# Patient Record
Sex: Female | Born: 1970 | Hispanic: No | Marital: Married | Smoking: Never smoker
Health system: Southern US, Community
[De-identification: ages and names within clinical notes are randomized; demographics above are authoritative.]

## PROBLEM LIST (undated history)

## (undated) DIAGNOSIS — S069XAA Unspecified intracranial injury with loss of consciousness status unknown, initial encounter: Secondary | ICD-10-CM

## (undated) DIAGNOSIS — J45909 Unspecified asthma, uncomplicated: Secondary | ICD-10-CM

## (undated) DIAGNOSIS — G4700 Insomnia, unspecified: Secondary | ICD-10-CM

## (undated) DIAGNOSIS — S069X9A Unspecified intracranial injury with loss of consciousness of unspecified duration, initial encounter: Secondary | ICD-10-CM

---

## 2017-03-09 ENCOUNTER — Emergency Department
Admission: EM | Admit: 2017-03-09 | Discharge: 2017-03-09 | Disposition: A | Discharge status: Home / Self Care | Payer: 59 | Source: Home / Self Care | Attending: Family Medicine | Admitting: Family Medicine

## 2017-03-09 ENCOUNTER — Encounter (HOSPITAL_BASED_OUTPATIENT_CLINIC_OR_DEPARTMENT_OTHER): Payer: Self-pay | Admitting: Emergency Medicine

## 2017-03-09 ENCOUNTER — Emergency Department (HOSPITAL_BASED_OUTPATIENT_CLINIC_OR_DEPARTMENT_OTHER): Payer: 59

## 2017-03-09 ENCOUNTER — Encounter: Payer: Self-pay | Admitting: Emergency Medicine

## 2017-03-09 ENCOUNTER — Emergency Department (HOSPITAL_BASED_OUTPATIENT_CLINIC_OR_DEPARTMENT_OTHER)
Admission: EM | Admit: 2017-03-09 | Discharge: 2017-03-09 | Disposition: A | Discharge status: Home / Self Care | Payer: 59 | Attending: Emergency Medicine | Admitting: Emergency Medicine

## 2017-03-09 DIAGNOSIS — R109 Unspecified abdominal pain: Secondary | ICD-10-CM | POA: Diagnosis not present

## 2017-03-09 DIAGNOSIS — J45909 Unspecified asthma, uncomplicated: Secondary | ICD-10-CM | POA: Diagnosis not present

## 2017-03-09 DIAGNOSIS — Z79899 Other long term (current) drug therapy: Secondary | ICD-10-CM | POA: Insufficient documentation

## 2017-03-09 DIAGNOSIS — N12 Tubulo-interstitial nephritis, not specified as acute or chronic: Secondary | ICD-10-CM

## 2017-03-09 DIAGNOSIS — N200 Calculus of kidney: Secondary | ICD-10-CM | POA: Diagnosis not present

## 2017-03-09 DIAGNOSIS — R1031 Right lower quadrant pain: Secondary | ICD-10-CM | POA: Diagnosis present

## 2017-03-09 DIAGNOSIS — R310 Gross hematuria: Secondary | ICD-10-CM

## 2017-03-09 HISTORY — DX: Insomnia, unspecified: G47.00

## 2017-03-09 HISTORY — DX: Unspecified asthma, uncomplicated: J45.909

## 2017-03-09 LAB — CBC
HEMATOCRIT: 43.7 % (ref 36.0–46.0)
HEMOGLOBIN: 14.8 g/dL (ref 12.0–15.0)
MCH: 30.1 pg (ref 26.0–34.0)
MCHC: 33.9 g/dL (ref 30.0–36.0)
MCV: 88.8 fL (ref 78.0–100.0)
Platelets: 274 10*3/uL (ref 150–400)
RBC: 4.92 MIL/uL (ref 3.87–5.11)
RDW: 13.7 % (ref 11.5–15.5)
WBC: 12.4 10*3/uL — ABNORMAL HIGH (ref 4.0–10.5)

## 2017-03-09 LAB — BASIC METABOLIC PANEL
Anion gap: 7 (ref 5–15)
BUN: 8 mg/dL (ref 6–20)
CO2: 27 mmol/L (ref 22–32)
Calcium: 9.1 mg/dL (ref 8.9–10.3)
Chloride: 104 mmol/L (ref 101–111)
Creatinine, Ser: 0.78 mg/dL (ref 0.44–1.00)
GFR calc Af Amer: >60 mL/min (ref >60)
GLUCOSE: 125 mg/dL — AB (ref 65–99)
POTASSIUM: 4.5 mmol/L (ref 3.5–5.1)
Sodium: 138 mmol/L (ref 135–145)

## 2017-03-09 LAB — POCT URINALYSIS DIP (MANUAL ENTRY)
BILIRUBIN UA: NEGATIVE
BILIRUBIN UA: NEGATIVE mg/dL
GLUCOSE UA: NEGATIVE mg/dL
Nitrite, UA: NEGATIVE
Protein Ur, POC: 100 mg/dL — AB
SPEC GRAV UA: 1.015 (ref 1.010–1.025)
UROBILINOGEN UA: 0.2 U/dL
pH, UA: 6.5 (ref 5.0–8.0)

## 2017-03-09 LAB — PREGNANCY, URINE: Preg Test, Ur: NEGATIVE

## 2017-03-09 MED ORDER — OXYCODONE-ACETAMINOPHEN 5-325 MG PO TABS
1.0000 | ORAL_TABLET | Freq: Once | ORAL | Status: AC
  Administered 2017-03-09: 1 via ORAL
  Filled 2017-03-09: qty 1

## 2017-03-09 MED ORDER — HYDROCODONE-ACETAMINOPHEN 5-325 MG PO TABS: 1.0000 | ORAL_TABLET | Freq: Four times a day (QID) | ORAL | 0 refills | Status: AC | PRN

## 2017-03-09 MED ORDER — CEPHALEXIN 500 MG PO CAPS: 500.0000 mg | ORAL_CAPSULE | Freq: Two times a day (BID) | ORAL | 0 refills | Status: AC

## 2017-03-09 MED ORDER — CEPHALEXIN 250 MG PO CAPS
500.0000 mg | ORAL_CAPSULE | Freq: Once | ORAL | Status: AC
  Administered 2017-03-09: 500 mg via ORAL
  Filled 2017-03-09: qty 2

## 2017-03-09 MED ORDER — ONDANSETRON 4 MG PO TBDP
4.0000 mg | ORAL_TABLET | Freq: Once | ORAL | Status: AC | PRN
  Administered 2017-03-09: 4 mg via ORAL
  Filled 2017-03-09: qty 1

## 2017-03-09 MED ORDER — OXYCODONE-ACETAMINOPHEN 5-325 MG PO TABS
1.0000 | ORAL_TABLET | ORAL | Status: DC | PRN
  Administered 2017-03-09: 1 via ORAL
  Filled 2017-03-09: qty 1

## 2017-03-09 NOTE — ED Triage Notes (Addendum)
R flank pain, nausea,  dysuria x 2 days, sent from UC.

## 2017-03-09 NOTE — ED Provider Notes (Signed)
[facility]    CSN: [id number] Arrival date & time: 03/09/17  1641     History   Chief Complaint Chief Complaint  Patient presents with  . Flank Pain    HPI Linda Mcneil is a 46 y.o. female.   Patient developed vague dysuria, urgency and frequency two days ago with lower abdominal pressure.  This morning she developed right flank pain and gross hematuria.  No fevers, chills, and sweats.  No past history of kidney stones.   The history is provided by the patient.  Flank Pain  This is a new problem. The current episode started 6 to 12 hours ago. The problem occurs constantly. The problem has been gradually worsening. Pertinent negatives include no abdominal pain and no shortness of breath. The symptoms are aggravated by walking, bending and twisting. Nothing relieves the symptoms. She has tried nothing for the symptoms.    Past Medical History:  Diagnosis Date  . Asthma   . Insomnia     There are no active problems to display for this patient.   History reviewed. No pertinent surgical history.  OB History    No data available       Home Medications    Prior to Admission medications   Medication Sig Start Date End Date Taking? Authorizing Provider  BuPROPion HCl (WELLBUTRIN PO) Take by mouth.   Yes [provider]  escitalopram (LEXAPRO) 20 MG tablet Take 20 mg by mouth daily.   Yes [provider]  lansoprazole (PREVACID) 30 MG capsule Take 30 mg by mouth daily at 12 noon.   Yes [provider]  montelukast (SINGULAIR) 10 MG tablet Take 10 mg by mouth at bedtime.   Yes [provider]    Family History History reviewed. No pertinent family history.  Social History Social History  Substance Use Topics  . Smoking status: Never Smoker  . Smokeless tobacco: Never Used  . Alcohol use Yes     Comment: 2-3 week      Allergies   Allegra [fexofenadine]; Azithromycin; Claritin [loratadine]; and Zyrtec  [cetirizine]   Review of Systems Review of Systems  Constitutional: Positive for fatigue and fever. Negative for appetite change, chills and diaphoresis.  Respiratory: Negative for shortness of breath.   Gastrointestinal: Negative for abdominal pain.  Genitourinary: Positive for dysuria, flank pain, frequency, hematuria and urgency. Negative for decreased urine volume and pelvic pain.  All other systems reviewed and are negative.    Physical Exam Triage Vital Signs ED Triage Vitals  Enc Vitals Group     BP 03/09/17 1701 125/87     Pulse --      Resp 03/09/17 1701 16     Temp 03/09/17 1701 97.9 F (36.6 C)     Temp Source 03/09/17 1701 Oral     SpO2 03/09/17 1701 100 %     Weight 03/09/17 1702 159 lb (72.1 kg)     Height 03/09/17 1702 5' 5.5" (1.664 m)     Head Circumference --      Peak Flow --      Pain Score --      Pain Loc --      Pain Edu? --      Excl. in [place]? --    No data found.   Updated Vital Signs BP 125/87 (BP Location: Left Arm)   Temp 97.9 F (36.6 C) (Oral)   Resp 16   Ht 5' 5.5" (1.664 m)   Wt 159 lb (  72.1 kg)   LMP 03/03/2017 (Exact Date)   SpO2 100%   BMI 26.06 kg/m   Visual Acuity Right Eye Distance:   Left Eye Distance:   Bilateral Distance:    Right Eye Near:   Left Eye Near:    Bilateral Near:     Physical Exam Nursing notes and Vital Signs reviewed. Appearance:  Patient appears stated age, and in no acute distress.    Eyes:  Pupils are equal, round, and reactive to light and accomodation.  Extraocular movement is intact.  Conjunctivae are not inflamed   Pharynx:  Normal; moist mucous membranes  Neck:  Supple.  No adenopathy Lungs:  Clear to auscultation.  Breath sounds are equal.  Moving air well. Heart:  Regular rate and rhythm without murmurs, rubs, or gallops.  Abdomen:  Nontender without masses or hepatosplenomegaly.  Bowel sounds are present.  Right flank tenderness is present.  Extremities:  No edema.  Skin:  No rash  present.     UC Treatments / Results  Labs (all labs ordered are listed, but only abnormal results are displayed) Labs Reviewed  POCT URINALYSIS DIP (MANUAL ENTRY) - Abnormal; Notable for the following:       Result Value   Clarity, UA cloudy (*)    Blood, UA large (*)    Protein Ur, POC =100 (*)    Leukocytes, UA Moderate (2+) (*)    All other components within normal limits  URINE CULTURE    EKG  EKG Interpretation None       Radiology No results found.  Procedures Procedures (including critical care time)  Medications Ordered in UC Medications - No data to display   Initial Impression / Assessment and Plan / UC Course  I have reviewed the triage vital signs and the nursing notes.  Pertinent labs & imaging results that were available during my care of the patient were reviewed by me and considered in my medical decision making (see chart for details).    Note large blood and moderate leuks on urinalysis. Suspect urolithiasis. Advised to proceed immediately to [facility] ED for CT renal study and further evaluation.    Final Clinical Impressions(s) / UC Diagnoses   Final diagnoses:  Right flank pain  Gross hematuria    New Prescriptions New Prescriptions   No medications on file        Kandra Nicolas, MD 03/09/17 [phone]

## 2017-03-09 NOTE — ED Notes (Signed)
Pt UA done at [facility] and was told she had a lot of blood in her urine.

## 2017-03-09 NOTE — ED Notes (Signed)
Patient c/o 2 days pain with urination, without burning, and increased frequency. Patient states pain is right flank.

## 2017-03-09 NOTE — ED Triage Notes (Signed)
Patient c/o flank pain, burning with urination

## 2017-03-10 NOTE — ED Provider Notes (Signed)
[place] [facility] Provider Note   CSN: [id number] Arrival date & time: 03/09/17  1826     History   Chief Complaint Chief Complaint  Patient presents with  . Flank Pain    HPI Linda Mcneil is a 46 y.o. female.  Patient is a 46 year old female with a prior medical history of asthma presenting today from urgent care for right-sided flank pain. Patient states yesterday she had some lower abdominal discomfort, dysuria, frequency and hematuria. She started drinking cranberry juice and water and symptoms seem to improve till later today when she started to develop severe right-sided flank pain. She describes the pain as sharp and crampy and worse with certain positions. She is having some pressure over her bladder but states that has significantly improved. She has had some nausea and a temperature of 99 but no fever over 100. She denies vomiting. No prior history of pyelonephritis, kidney stones or recurrent urinary tract infections. Last menstrual period was last week. Patient had a UA done at urgent care which showed leukocytes and hemoglobin on dip.   The history is provided by the patient.    Past Medical History:  Diagnosis Date  . Asthma   . Insomnia     There are no active problems to display for this patient.   History reviewed. No pertinent surgical history.  OB History    No data available       Home Medications    Prior to Admission medications   Medication Sig Start Date End Date Taking? Authorizing Provider  BuPROPion HCl (WELLBUTRIN PO) Take by mouth.    [provider]  cephALEXin (KEFLEX) 500 MG capsule Take 1 capsule (500 mg total) by mouth 2 (two) times daily. 03/09/17   Blanchie Dessert, MD  escitalopram (LEXAPRO) 20 MG tablet Take 20 mg by mouth daily.    [provider]  HYDROcodone-acetaminophen (NORCO/VICODIN) 5-325 MG tablet Take 1 tablet by mouth every 6 (six) hours as needed. 03/09/17   Blanchie Dessert, MD  lansoprazole  (PREVACID) 30 MG capsule Take 30 mg by mouth daily at 12 noon.    [provider]  montelukast (SINGULAIR) 10 MG tablet Take 10 mg by mouth at bedtime.    [provider]    Family History No family history on file.  Social History Social History  Substance Use Topics  . Smoking status: Never Smoker  . Smokeless tobacco: Never Used  . Alcohol use Yes     Comment: 2-3 week      Allergies   Allegra [fexofenadine]; Azithromycin; Claritin [loratadine]; and Zyrtec [cetirizine]   Review of Systems Review of Systems  All other systems reviewed and are negative.    Physical Exam Updated Vital Signs BP (!) 121/94 (BP Location: Right Arm)   Pulse 75   Temp 99.5 F (37.5 C) (Oral)   Resp 18   LMP 03/03/2017 (Exact Date)   SpO2 99%   Physical Exam  Constitutional: She is oriented to person, place, and time. She appears well-developed and well-nourished. No distress.  HENT:  Head: Normocephalic and atraumatic.  Mouth/Throat: Oropharynx is clear and moist.  Eyes: Pupils are equal, round, and reactive to light. Conjunctivae and EOM are normal.  Neck: Normal range of motion. Neck supple.  Cardiovascular: Normal rate, regular rhythm and intact distal pulses.   No murmur heard. Pulmonary/Chest: Effort normal and breath sounds normal. No respiratory distress. She has no wheezes. She has no rales.  Abdominal: Soft. She exhibits no distension. There  is no tenderness. There is CVA tenderness. There is no rebound and no guarding.  Right-sided flank tenderness  Musculoskeletal: Normal range of motion. She exhibits no edema or tenderness.  Neurological: She is alert and oriented to person, place, and time.  Skin: Skin is warm and dry. No rash noted. No erythema.  Psychiatric: She has a normal mood and affect. Her behavior is normal.  Nursing note and vitals reviewed.    ED Treatments / Results  Labs (all labs ordered are listed, but only abnormal results are  displayed) Labs Reviewed  BASIC METABOLIC PANEL - Abnormal; Notable for the following:       Result Value   Glucose, Bld 125 (*)    All other components within normal limits  CBC - Abnormal; Notable for the following:    WBC 12.4 (*)    All other components within normal limits  URINE CULTURE  PREGNANCY, URINE    EKG  EKG Interpretation None       Radiology Ct Renal Stone Study  Result Date: 03/09/2017 CLINICAL DATA:  46 year old female with flank pain and burning with urination for 2 days. EXAM: CT ABDOMEN AND PELVIS WITHOUT CONTRAST TECHNIQUE: Multidetector CT imaging of the abdomen and pelvis was performed following the standard protocol without IV contrast. COMPARISON:  None. FINDINGS: Lower chest: Normal.  No pleural or pericardial effusion. Hepatobiliary: Negative noncontrast liver and gallbladder. Pancreas: Negative. Spleen: Negative. Adrenals/Urinary Tract: Normal adrenal glands. Noncontrast left kidney and left ureter appear normal. The right renal collecting system and renal sinus fat are asymmetrically indistinct but there is no definite right hydronephrosis. There is associated right periureteral stranding, but no definite hydroureter or right ureteral calculus. No nephrolithiasis identified. Diminutive urinary bladder.  Mild perivesical stranding. Stomach/Bowel: Redundant sigmoid colon with retained stool throughout the rectum and sigmoid. Retained stool throughout the left colon. Less retained stool in the transverse and right colon. Normal retrocecal appendix. Negative terminal ileum. No dilated small bowel. Unremarkable stomach and duodenum. No abdominal free fluid. Vascular/Lymphatic: Vascular patency is not evaluated in the absence of IV contrast. No lymphadenopathy identified. Reproductive: Negative. Other: No pelvic free fluid. Musculoskeletal: No acute osseous abnormality identified. IMPRESSION: 1. Mild inflammatory stranding along the margins of the urinary bladder  compatible with UTI. Superimposed right periureteral and right renal sinus inflammation suggesting ascending infection on the right side. Note that pyelonephritis cannot be excluded in the absence of IV contrast. 2. No urologic calculus identified, and no obstructive uropathy suspected. 3. Normal appendix. Retained stool in the colon which is redundant distally. Electronically Signed   By: Genevie Ann M.D.   On: 03/09/2017 21:44    Procedures Procedures (including critical care time)  Medications Ordered in ED Medications  oxyCODONE-acetaminophen (PERCOCET/ROXICET) 5-325 MG per tablet 1 tablet (1 tablet Oral Given 03/09/17 1845)  ondansetron (ZOFRAN-ODT) disintegrating tablet 4 mg (4 mg Oral Given 03/09/17 1845)  cephALEXin (KEFLEX) capsule 500 mg (500 mg Oral Given 03/09/17 2243)  oxyCODONE-acetaminophen (PERCOCET/ROXICET) 5-325 MG per tablet 1 tablet (1 tablet Oral Given 03/09/17 2243)     Initial Impression / Assessment and Plan / ED Course  I have reviewed the triage vital signs and the nursing notes.  Pertinent labs & imaging results that were available during my care of the patient were reviewed by me and considered in my medical decision making (see chart for details).     Patient presenting with symptoms concerning for renal colic versus pyelonephritis. Patient is well-appearing on exam and pain improved after  receiving pain medication in the waiting room. CT of the abdomen and pelvis show that patient has evidence most suggestive of pyelonephritis. Patient has a mild leukocytosis of 12 but otherwise CBC and CMP are within normal limits. Urine culture was sent and patient was started on Keflex. She is also given pain control.  Final Clinical Impressions(s) / ED Diagnoses   Final diagnoses:  Pyelonephritis    New Prescriptions Discharge Medication List as of 03/09/2017 10:25 PM    START taking these medications   Details  cephALEXin (KEFLEX) 500 MG capsule Take 1 capsule (500 mg total) by  mouth 2 (two) times daily., Starting Sun 03/09/2017, Print    HYDROcodone-acetaminophen (NORCO/VICODIN) 5-325 MG tablet Take 1 tablet by mouth every 6 (six) hours as needed., Starting Sun 03/09/2017, Print         Blanchie Dessert, MD 03/10/17 [phone]

## 2017-03-12 LAB — URINE CULTURE: Culture: >=100000 — AB

## 2017-03-13 ENCOUNTER — Telehealth: Payer: Self-pay | Admitting: *Deleted

## 2017-03-13 NOTE — Progress Notes (Signed)
ED Antimicrobial Stewardship Positive Culture Follow Up  Linda Mcneil is an 46 y.o. female who presented to [facility] on 03/09/2017 with a chief complaint of  Chief Complaint  Patient presents with  . Flank Pain   ? Recent Results (from the past 720 hour(s))  Urine Culture     Status: Abnormal   Collection Time: 03/09/17  9:11 PM  Result Value Ref Range Status   Specimen Description URINE, CATHETERIZED  Final   Special Requests NONE  Final   Culture >=100,000 COLONIES/mL STAPHYLOCOCCUS SAPROPHYTICUS (A)  Final   Report Status 03/12/2017 FINAL  Final   Organism ID, Bacteria STAPHYLOCOCCUS SAPROPHYTICUS (A)  Final      Susceptibility   Staphylococcus saprophyticus - MIC*    CIPROFLOXACIN <=0.5 SENSITIVE Sensitive     GENTAMICIN <=0.5 SENSITIVE Sensitive     NITROFURANTOIN <=16 SENSITIVE Sensitive     OXACILLIN 2 RESISTANT Resistant     TETRACYCLINE <=1 SENSITIVE Sensitive     VANCOMYCIN <=0.5 SENSITIVE Sensitive     TRIMETH/SULFA <=10 SENSITIVE Sensitive     CLINDAMYCIN <=0.25 SENSITIVE Sensitive     RIFAMPIN <=0.5 SENSITIVE Sensitive     Inducible Clindamycin NEGATIVE Sensitive     * >=100,000 COLONIES/mL STAPHYLOCOCCUS SAPROPHYTICUS   ? Treated with cephalexin, organism resistant to prescribed antimicrobial ? New antibiotic prescription: Bactrim 1 DS tablet PO BID for 14 days  ? ED Provider: Margarita Mail PA-C ? Duayne Cal 03/13/2017, 11:38 AM Infectious Diseases Pharmacist Phone# [phone]

## 2017-03-13 NOTE — Telephone Encounter (Signed)
Post ED Visit - Positive Culture Follow-up: Unsuccessful Patient Follow-up  Culture assessed and recommendations reviewed by:  []  Elenor Quinones, Pharm.D. []  Heide Guile, Pharm.D., BCPS AQ-ID []  Parks Neptune, Pharm.D., BCPS []  Alycia Rossetti, Pharm.D., BCPS []  [place], Pharm.D., BCPS, AAHIVP []  Legrand Como, Pharm.D., BCPS, AAHIVP []  Salome Arnt, PharmD, BCPS []  Dimitri Ped, PharmD, BCPS [x]  Vincenza Hews, PharmD, BCPS  Positive urine culture, reviewed by Margarita Mail PA-C  []  Patient discharged without antimicrobial prescription and treatment is now indicated [x]  Organism is resistant to prescribed ED discharge antimicrobial []  Patient with positive blood cultures   Unable to contact patient after 3 attempts, letter will be sent to address on file  Ardeen Fillers 03/13/2017, 2:48 PM

## 2017-05-03 ENCOUNTER — Emergency Department
Admission: EM | Admit: 2017-05-03 | Discharge: 2017-05-03 | Disposition: A | Discharge status: Home / Self Care | Payer: 59 | Source: Home / Self Care | Attending: Family Medicine | Admitting: Family Medicine

## 2017-05-03 ENCOUNTER — Encounter: Payer: Self-pay | Admitting: Emergency Medicine

## 2017-05-03 DIAGNOSIS — B9789 Other viral agents as the cause of diseases classified elsewhere: Secondary | ICD-10-CM

## 2017-05-03 DIAGNOSIS — J4541 Moderate persistent asthma with (acute) exacerbation: Secondary | ICD-10-CM | POA: Diagnosis not present

## 2017-05-03 DIAGNOSIS — J069 Acute upper respiratory infection, unspecified: Secondary | ICD-10-CM

## 2017-05-03 HISTORY — DX: Unspecified intracranial injury with loss of consciousness of unspecified duration, initial encounter: S06.9X9A

## 2017-05-03 HISTORY — DX: Unspecified intracranial injury with loss of consciousness status unknown, initial encounter: S06.9XAA

## 2017-05-03 MED ORDER — DOXYCYCLINE HYCLATE 100 MG PO CAPS: 100.0000 mg | ORAL_CAPSULE | Freq: Two times a day (BID) | ORAL | 0 refills | Status: AC

## 2017-05-03 MED ORDER — METHYLPREDNISOLONE SODIUM SUCC 125 MG IJ SOLR
125.0000 mg | Freq: Once | INTRAMUSCULAR | Status: AC
  Administered 2017-05-03: 125 mg via INTRAMUSCULAR

## 2017-05-03 MED ORDER — PREDNISONE 20 MG PO TABS: ORAL_TABLET | ORAL | 0 refills | Status: AC

## 2017-05-03 NOTE — ED Triage Notes (Signed)
Patient has chronic asthma but has had more wheezing than usual over past 2 weeks and her inhalers are not providing lasting relief.

## 2017-05-03 NOTE — ED Provider Notes (Signed)
[facility]    CSN: [id number] Arrival date & time: 05/03/17  1053     History   Chief Complaint Chief Complaint  Patient presents with  . Wheezing    HPI Linda Mcneil is a 46 y.o. female.   Patient developed mild URI symptoms about two weeks ago that generally improved except for persistent cough, wheezing, and shortness of breath with activity.  She has a history of moderate persistent asthma that is generally controlled with Singulair, Breo Ellipta, and ProAir inhaler.  She now feels well but her wheezing is not improved with her inhalers.  No fevers, chills, and sweats.   The history is provided by the patient.    Past Medical History:  Diagnosis Date  . Asthma   . Insomnia   . TBI (traumatic brain injury) ([place])     There are no active problems to display for this patient.   History reviewed. No pertinent surgical history.  OB History    No data available       Home Medications    Prior to Admission medications   Medication Sig Start Date End Date Taking? Authorizing Provider  busPIRone (BUSPAR) 10 MG tablet Take 10 mg by mouth 3 (three) times daily.   Yes [provider]  fluticasone furoate-vilanterol (BREO ELLIPTA) 200-25 MCG/INH AEPB Inhale 1 puff into the lungs daily.   Yes [provider]  BuPROPion HCl (WELLBUTRIN PO) Take by mouth.    [provider]  cephALEXin (KEFLEX) 500 MG capsule Take 1 capsule (500 mg total) by mouth 2 (two) times daily. 03/09/17   Blanchie Dessert, MD  doxycycline (VIBRAMYCIN) 100 MG capsule Take 1 capsule (100 mg total) by mouth 2 (two) times daily. Take with food (Rx void after 05/11/17) 05/03/17   Kandra Nicolas, MD  escitalopram (LEXAPRO) 20 MG tablet Take 20 mg by mouth daily.    [provider]  HYDROcodone-acetaminophen (NORCO/VICODIN) 5-325 MG tablet Take 1 tablet by mouth every 6 (six) hours as needed. 03/09/17   Blanchie Dessert, MD  lansoprazole (PREVACID) 30 MG  capsule Take 30 mg by mouth daily at 12 noon.    [provider]  montelukast (SINGULAIR) 10 MG tablet Take 10 mg by mouth at bedtime.    [provider]  predniSONE (DELTASONE) 20 MG tablet Take one tab by mouth twice daily for 4 days, then one daily for 3 days. Take with food. 05/03/17   Kandra Nicolas, MD    Family History No family history on file.  Social History Social History  Substance Use Topics  . Smoking status: Never Smoker  . Smokeless tobacco: Never Used  . Alcohol use Yes     Comment: 2-3 week      Allergies   Allegra [fexofenadine]; Azithromycin; Claritin [loratadine]; and Zyrtec [cetirizine]   Review of Systems Review of Systems  + sore throat, resolved + cough No pleuritic pain + wheezing + nasal congestion, improved + post-nasal drainage No sinus pain/pressure No itchy/red eyes No earache No hemoptysis + SOB with activity No fever/chills No nausea No vomiting No abdominal pain No diarrhea No urinary symptoms No skin rash + fatigue No myalgias No headache Used OTC meds without relief    Physical Exam Triage Vital Signs ED Triage Vitals  Enc Vitals Group     BP 05/03/17 1113 119/84     Pulse Rate 05/03/17 1113 84     Resp 05/03/17 1113 18     Temp 05/03/17 1113  97.8 F (36.6 C)     Temp Source 05/03/17 1113 Oral     SpO2 05/03/17 1113 97 %     Weight 05/03/17 1119 164 lb (74.4 kg)     Height 05/03/17 1119 5' 6.5" (1.689 m)     Head Circumference --      Peak Flow --      Pain Score --      Pain Loc --      Pain Edu? --      Excl. in [place]? --    No data found.   Updated Vital Signs BP 119/84 (BP Location: Left Arm)   Pulse 84   Temp 97.8 F (36.6 C) (Oral)   Resp 18   Ht 5' 6.5" (1.689 m)   Wt 164 lb (74.4 kg)   LMP 05/03/2017 (Exact Date)   SpO2 97%   BMI 26.07 kg/m   Visual Acuity Right Eye Distance:   Left Eye Distance:   Bilateral Distance:    Right Eye Near:   Left Eye Near:    Bilateral  Near:     Physical Exam Nursing notes and Vital Signs reviewed. Appearance:  Patient appears stated age, and in no acute distress Eyes:  Pupils are equal, round, and reactive to light and accomodation.  Extraocular movement is intact.  Conjunctivae are not inflamed  Ears:  Canals normal.  Tympanic membranes normal.  Nose:  Mildly congested turbinates.  No sinus tenderness.   Pharynx:  Normal Neck:  Supple.  Enlarged posterior/lateral nodes are palpated bilaterally, tender to palpation on the left.   Lungs:   Faint bilateral expiratory wheezes.  Breath sounds are equal.  Moving air well. Heart:  Regular rate and rhythm without murmurs, rubs, or gallops.  Abdomen:  Nontender without masses or hepatosplenomegaly.  Bowel sounds are present.  No CVA or flank tenderness.  Extremities:  No edema.  Skin:  No rash present.    UC Treatments / Results  Labs (all labs ordered are listed, but only abnormal results are displayed) Labs Reviewed - No data to display  EKG  EKG Interpretation None       Radiology No results found.  Procedures Procedures (including critical care time)  Medications Ordered in UC Medications  methylPREDNISolone sodium succinate (SOLU-MEDROL) 125 mg/2 mL injection 125 mg (125 mg Intramuscular Given 05/03/17 1137)     Initial Impression / Assessment and Plan / UC Course  I have reviewed the triage vital signs and the nursing notes.  Pertinent labs & imaging results that were available during my care of the patient were reviewed by me and considered in my medical decision making (see chart for details).    There is no evidence of bacterial infection today.   Administered Solumedrol 133m IM  Begin prednisone burst/taper on Sunday 05/04/17. Take plain guaifenesin (12043mextended release tabs such as Mucinex) twice daily, with plenty of water, for cough and congestion.   May take Delsym Cough Suppressant at bedtime for nighttime cough.  Stop all  antihistamines for now, and other non-prescription cough/cold preparations. Continue all inhalers as prescribed.  Continue Singulair. Begin Doxycycline if not improving about five days or if persistent fever develops (Given a prescription to hold, with an expiration date)  Follow-up with family doctor if not improving about10 days.     Final Clinical Impressions(s) / UC Diagnoses   Final diagnoses:  Moderate persistent asthma with (acute) exacerbation  Viral URI with cough    New Prescriptions New Prescriptions  DOXYCYCLINE (VIBRAMYCIN) 100 MG CAPSULE    Take 1 capsule (100 mg total) by mouth 2 (two) times daily. Take with food (Rx void after 05/11/17)   PREDNISONE (DELTASONE) 20 MG TABLET    Take one tab by mouth twice daily for 4 days, then one daily for 3 days. Take with food.        Kandra Nicolas, MD 05/05/17 [phone]

## 2017-05-03 NOTE — Discharge Instructions (Signed)
Begin prednisone on Sunday 05/04/17. Take plain guaifenesin (1269m extended release tabs such as Mucinex) twice daily, with plenty of water, for cough and congestion.   May take Delsym Cough Suppressant at bedtime for nighttime cough.  Stop all antihistamines for now, and other non-prescription cough/cold preparations. Continue all inhalers as prescribed.  Continue Singulair. Begin Doxycycline if not improving about five days or if persistent fever develops   Follow-up with family doctor if not improving about10 days.

## 2017-06-02 ENCOUNTER — Telehealth: Payer: Self-pay | Admitting: Emergency Medicine

## 2017-06-02 NOTE — Telephone Encounter (Signed)
Lost to followup 

## 2019-08-26 IMAGING — CT CT RENAL STONE PROTOCOL
2 of 4 series · 16 of 46 positions shown, 18 images · non-contrast
Comparison: None.

CLINICAL DATA: 45-year-old female with flank pain and burning with
urination for 2 days.

EXAM:
CT ABDOMEN AND PELVIS WITHOUT CONTRAST
TECHNIQUE: Multidetector CT imaging of the abdomen and pelvis was performed
following the standard protocol without IV contrast.

[Series 2: axial st · axial · 0.66mm/px · z∈[+365,+770]mm · 13 of 89 slices shown, 15 images]
[im 4/89  soft-tissue]
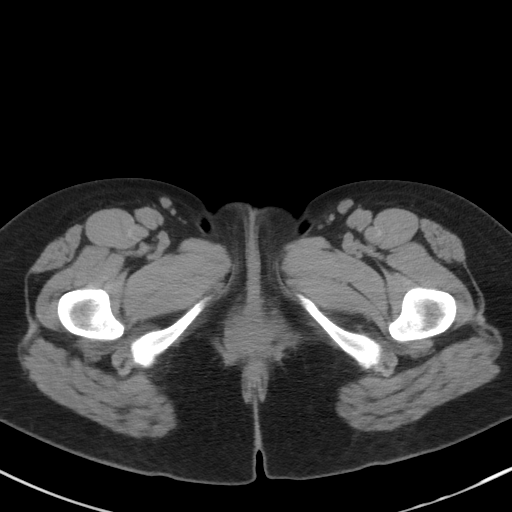
[im 4/89  bone]
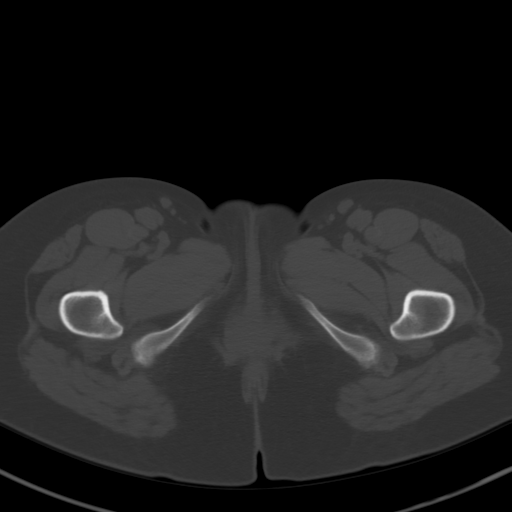
[im 12/89  soft-tissue]
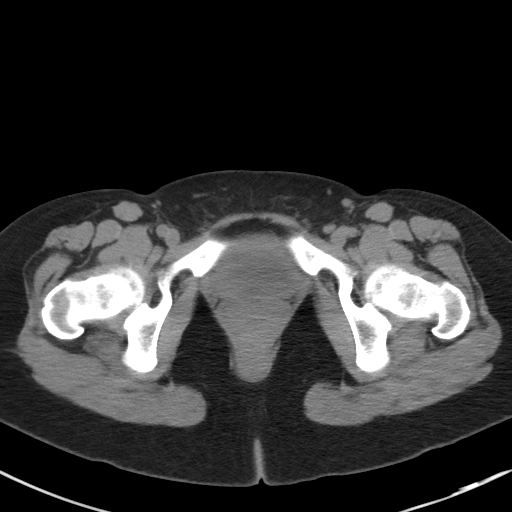
[im 19/89  soft-tissue]
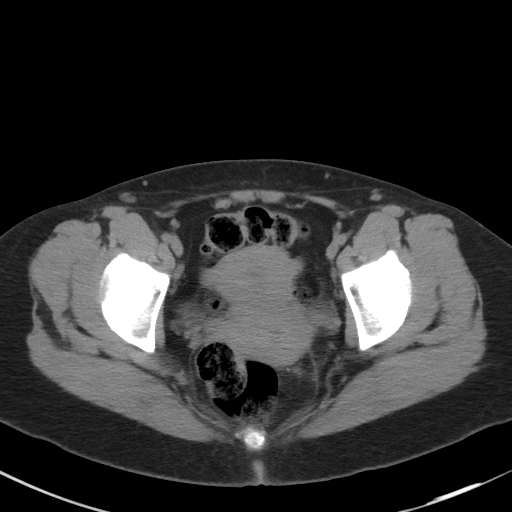
[im 26/89  soft-tissue]
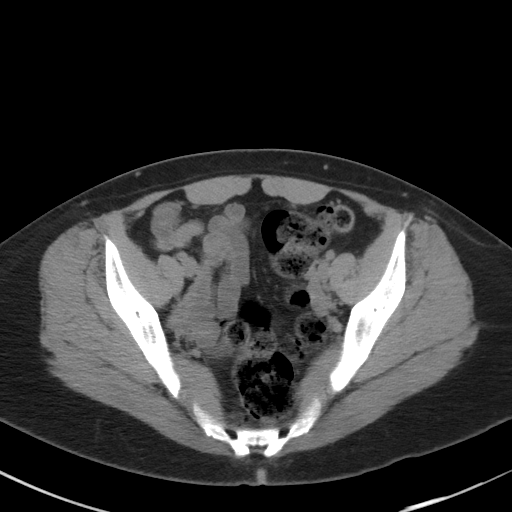
[im 30/89  soft-tissue]
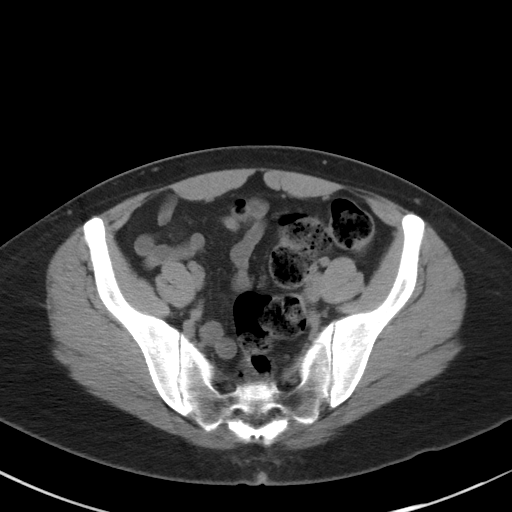
[im 37/89  soft-tissue]
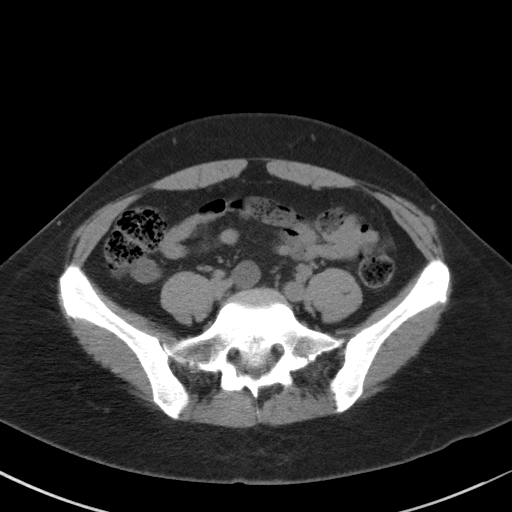
[im 45/89  soft-tissue]
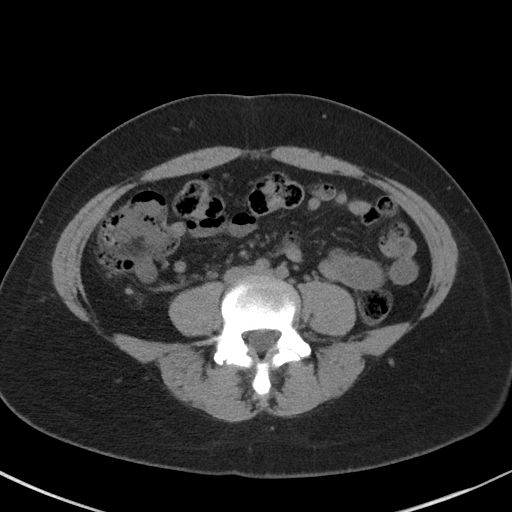
[im 52/89  soft-tissue]
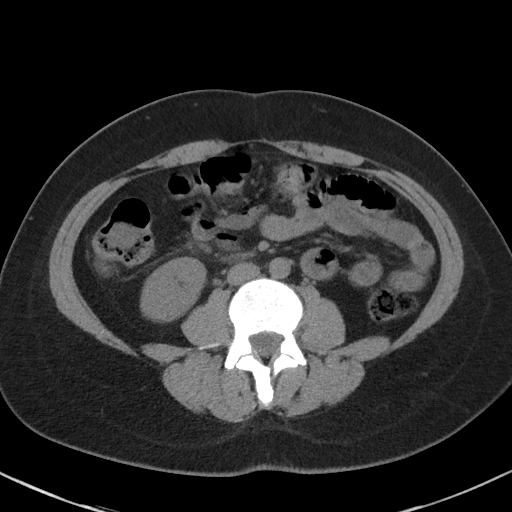
[im 59/89  soft-tissue]
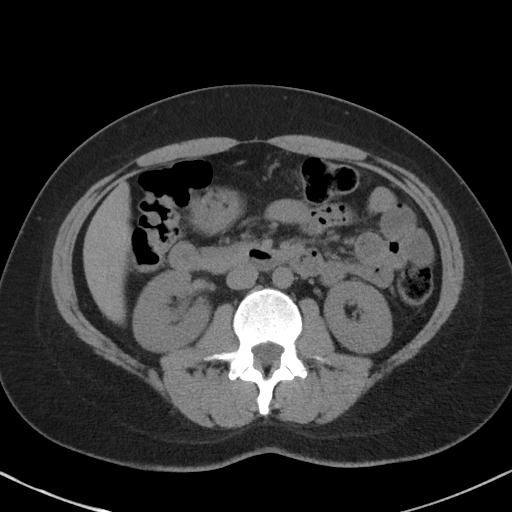
[im 59/89  bone]
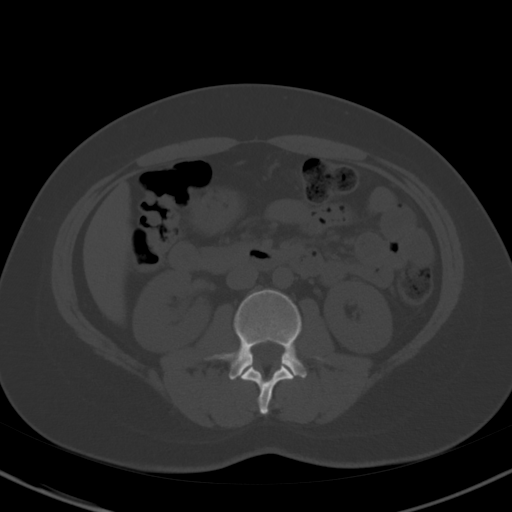
[im 63/89  soft-tissue]
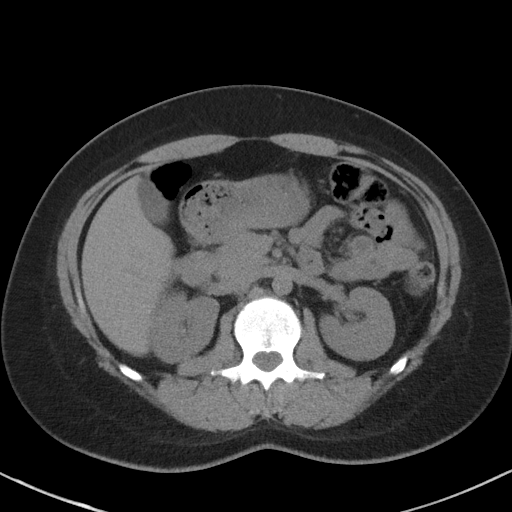
[im 70/89  soft-tissue]
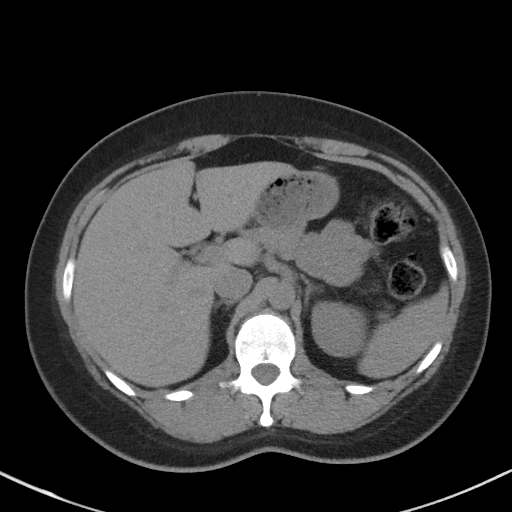
[im 78/89  soft-tissue]
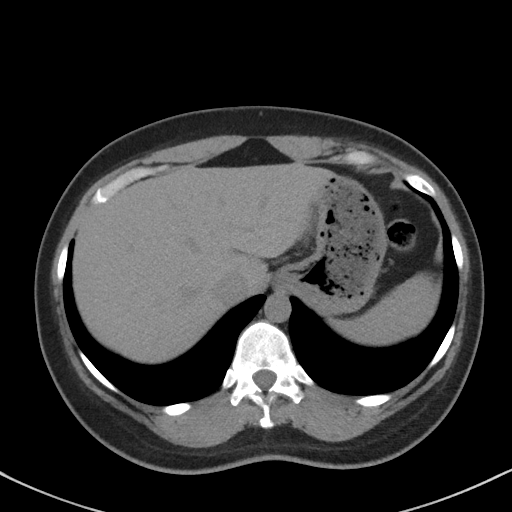
[im 85/89  soft-tissue]
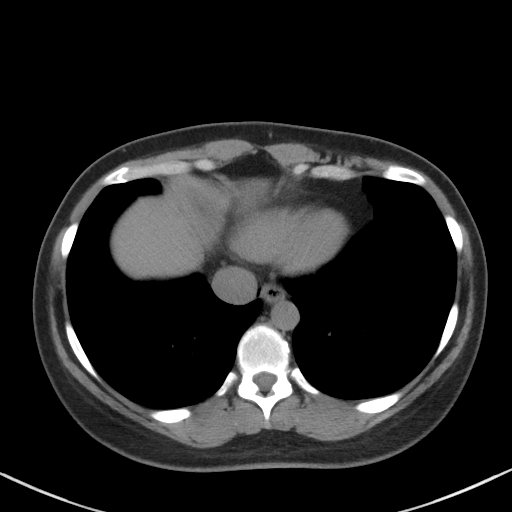

[Series 4: coronal st · coronal · 0.73mm/px · 3 of 101 slices shown]
[im 34/101  soft-tissue]
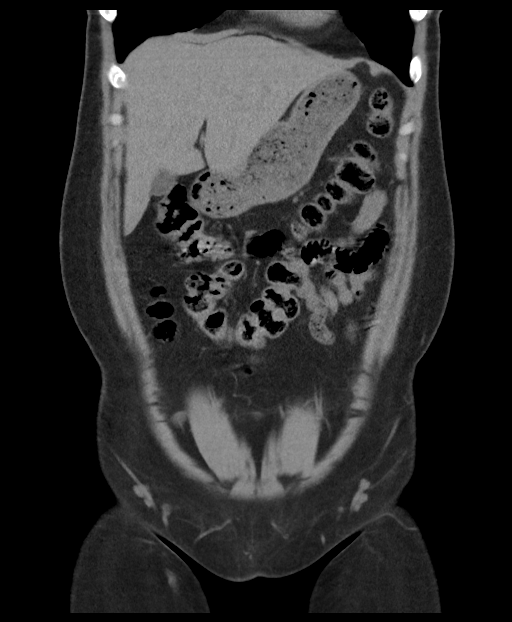
[im 45/101  soft-tissue]
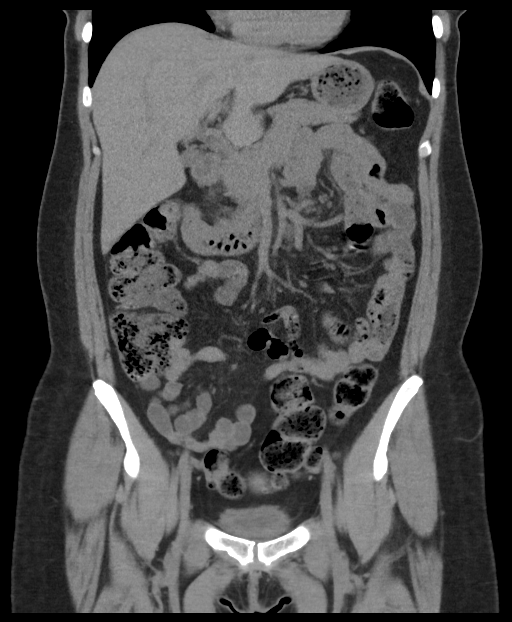
[im 56/101  soft-tissue]
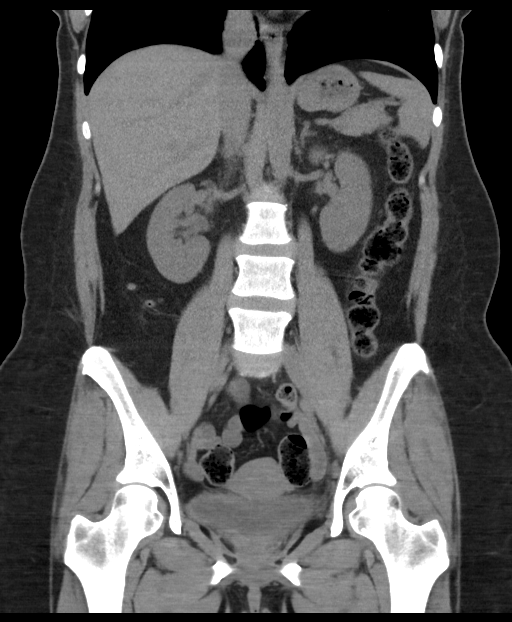

[16 of 46 positions shown; findings below may reference images not displayed]

FINDINGS: Lower chest: Normal.  No pleural or pericardial effusion.

Hepatobiliary: Negative noncontrast liver and gallbladder.

Pancreas: Negative.

Spleen: Negative.

Adrenals/Urinary Tract: Normal adrenal glands.

Noncontrast left kidney and left ureter appear normal.

The right renal collecting system and renal sinus fat are
asymmetrically indistinct but there is no definite right
hydronephrosis. There is associated right periureteral stranding,
but no definite hydroureter or right ureteral calculus. No
nephrolithiasis identified.

Diminutive urinary bladder.  Mild perivesical stranding.

Stomach/Bowel: Redundant sigmoid colon with retained stool
throughout the rectum and sigmoid. Retained stool throughout the
left colon. Less retained stool in the transverse and right colon.
Normal retrocecal appendix. Negative terminal ileum. No dilated
small bowel. Unremarkable stomach and duodenum.

No abdominal free fluid.

Vascular/Lymphatic: Vascular patency is not evaluated in the absence
of IV contrast. No lymphadenopathy identified.

Reproductive: Negative.

Other: No pelvic free fluid.

Musculoskeletal: No acute osseous abnormality identified.
IMPRESSION: 1. Mild inflammatory stranding along the margins of the urinary
bladder compatible with UTI. Superimposed right periureteral and
right renal sinus inflammation suggesting ascending infection on the
right side. Note that pyelonephritis cannot be excluded in the
absence of IV contrast.
2. No urologic calculus identified, and no obstructive uropathy
suspected.
3. Normal appendix. Retained stool in the colon which is redundant
distally.

## 2019-09-20 ENCOUNTER — Other Ambulatory Visit: Payer: Self-pay

## 2019-09-20 ENCOUNTER — Emergency Department
Admission: EM | Admit: 2019-09-20 | Discharge: 2019-09-20 | Disposition: A | Discharge status: Home / Self Care | Payer: 59 | Source: Home / Self Care

## 2019-09-20 DIAGNOSIS — R5383 Other fatigue: Secondary | ICD-10-CM

## 2019-09-20 DIAGNOSIS — Z789 Other specified health status: Secondary | ICD-10-CM

## 2019-09-20 DIAGNOSIS — R519 Headache, unspecified: Secondary | ICD-10-CM

## 2019-09-20 DIAGNOSIS — Z20818 Contact with and (suspected) exposure to other bacterial communicable diseases: Secondary | ICD-10-CM

## 2019-09-20 DIAGNOSIS — J029 Acute pharyngitis, unspecified: Secondary | ICD-10-CM

## 2019-09-20 DIAGNOSIS — R52 Pain, unspecified: Secondary | ICD-10-CM

## 2019-09-20 LAB — POCT RAPID STREP A (OFFICE): Rapid Strep A Screen: NEGATIVE

## 2019-09-20 NOTE — ED Provider Notes (Signed)
[facility]    CSN: [id number] Arrival date & time: 09/20/19  1621      History   Chief Complaint Chief Complaint  Patient presents with  . Generalized Body Aches  . Fatigue    HPI [name] is a 49 y.o. female.   HPI  Linda Mcneil is a 49 y.o. female presenting to UC with c/o 3 days of body aches, fatigue, mild headache, and minimal sore throat.  She is visiting her 70yo grandson, who tested positive for strep throat today.  Mild nasal congestion, worse at night. Pt's daughter requested pt be tested for strep throat.  Pt flew into the area from [place] on 09/17/19.  She felt fine before leaving [place].  Her husband also developed symptoms 3 days ago. He was tested for Covid the same day his symptoms started. He tested negative for covid via a rapid and send out test. Pt does not believe she has Covid and declined testing. Denies fever, chills, n/v/d.    Past Medical History:  Diagnosis Date  . Asthma   . Insomnia   . TBI (traumatic brain injury) ([place])     There are no problems to display for this patient.   History reviewed. No pertinent surgical history.  OB History   No obstetric history on file.      Home Medications    Prior to Admission medications   Medication Sig Start Date End Date Taking? Authorizing Provider  escitalopram (LEXAPRO) 20 MG tablet Take 20 mg by mouth daily.   Yes [provider]  montelukast (SINGULAIR) 10 MG tablet Take 10 mg by mouth at bedtime.   Yes [provider]  BuPROPion HCl (WELLBUTRIN PO) Take by mouth.    [provider]  busPIRone (BUSPAR) 10 MG tablet Take 10 mg by mouth 3 (three) times daily.    [provider]  cephALEXin (KEFLEX) 500 MG capsule Take 1 capsule (500 mg total) by mouth 2 (two) times daily. 03/09/17   Blanchie Dessert, MD  doxycycline (VIBRAMYCIN) 100 MG capsule Take 1 capsule (100 mg total) by mouth 2 (two) times daily. Take with food (Rx void after  05/11/17) 05/03/17   Kandra Nicolas, MD  fluticasone furoate-vilanterol (BREO ELLIPTA) 200-25 MCG/INH AEPB Inhale 1 puff into the lungs daily.    [provider]  HYDROcodone-acetaminophen (NORCO/VICODIN) 5-325 MG tablet Take 1 tablet by mouth every 6 (six) hours as needed. 03/09/17   Blanchie Dessert, MD  lansoprazole (PREVACID) 30 MG capsule Take 30 mg by mouth daily at 12 noon.    [provider]  predniSONE (DELTASONE) 20 MG tablet Take one tab by mouth twice daily for 4 days, then one daily for 3 days. Take with food. 05/03/17   Kandra Nicolas, MD    Family History Family History  Problem Relation Age of Onset  . Cancer Mother        thyroid CA  . Diabetes Mother   . Asthma Father   . Diabetes Father     Social History Social History   Tobacco Use  . Smoking status: Never Smoker  . Smokeless tobacco: Never Used  Substance Use Topics  . Alcohol use: Yes    Comment: 2-3 week   . Drug use: No     Allergies   Allegra [fexofenadine], Azithromycin, Claritin [loratadine], and Zyrtec [cetirizine]   Review of Systems Review of Systems  Constitutional: Positive for fatigue. Negative for chills and fever.  HENT: Positive for congestion  and sore throat. Negative for ear pain, trouble swallowing and voice change.   Respiratory: Negative for cough and shortness of breath.   Cardiovascular: Negative for chest pain and palpitations.  Gastrointestinal: Negative for abdominal pain, diarrhea, nausea and vomiting.  Musculoskeletal: Positive for arthralgias and myalgias. Negative for back pain.       Body aches  Skin: Negative for rash.  Neurological: Positive for headaches. Negative for dizziness and light-headedness.  All other systems reviewed and are negative.    Physical Exam Triage Vital Signs ED Triage Vitals  Enc Vitals Group     BP --      Pulse Rate 09/20/19 1638 89     Resp 09/20/19 1638 16     Temp 09/20/19 1638 98.1 F (36.7 C)     Temp  Source 09/20/19 1638 Oral     SpO2 09/20/19 1638 100 %     Weight --      Height --      Head Circumference --      Peak Flow --      Pain Score 09/20/19 1635 6     Pain Loc --      Pain Edu? --      Excl. in [place]? --    No data found.  Updated Vital Signs Pulse 89   Temp 98.1 F (36.7 C) (Oral)   Resp 16   SpO2 100%   Visual Acuity Right Eye Distance:   Left Eye Distance:   Bilateral Distance:    Right Eye Near:   Left Eye Near:    Bilateral Near:     Physical Exam Vitals and nursing note reviewed.  Constitutional:      General: She is not in acute distress.    Appearance: Normal appearance. She is well-developed. She is not ill-appearing, toxic-appearing or diaphoretic.  HENT:     Head: Normocephalic and atraumatic.     Right Ear: Tympanic membrane and ear canal normal.     Left Ear: Tympanic membrane and ear canal normal.     Nose: Nose normal.     Right Sinus: No maxillary sinus tenderness or frontal sinus tenderness.     Left Sinus: No maxillary sinus tenderness or frontal sinus tenderness.     Mouth/Throat:     Lips: Pink.     Mouth: Mucous membranes are moist.     Pharynx: Oropharynx is clear. Uvula midline. No pharyngeal swelling, oropharyngeal exudate, posterior oropharyngeal erythema or uvula swelling.     Tonsils: No tonsillar exudate or tonsillar abscesses.  Cardiovascular:     Rate and Rhythm: Normal rate and regular rhythm.  Pulmonary:     Effort: Pulmonary effort is normal. No respiratory distress.     Breath sounds: Normal breath sounds.  Musculoskeletal:        General: Normal range of motion.     Cervical back: Normal range of motion and neck supple.  Lymphadenopathy:     Cervical: Cervical adenopathy ( anterior) present.  Skin:    General: Skin is warm and dry.     Findings: No rash.  Neurological:     Mental Status: She is alert and oriented to person, place, and time.  Psychiatric:        Behavior: Behavior normal.      UC  Treatments / Results  Labs (all labs ordered are listed, but only abnormal results are displayed) Labs Reviewed - No data to display  EKG   Radiology No results found.  Procedures Procedures (including critical care time)  Medications Ordered in UC Medications - No data to display  Initial Impression / Assessment and Plan / UC Course  I have reviewed the triage vital signs and the nursing notes.  Pertinent labs & imaging results that were available during my care of the patient were reviewed by me and considered in my medical decision making (see chart for details).     Pt insistent on strep test.  Rapid strep: NEGATIVE Culture sent Discussed possibility of covid. Husband may have had a false negative due to being tested same day of symptom onset.  Pt initially agreed to have covid test. At time of discharge, pt declined. AVS was provided.  Final Clinical Impressions(s) / UC Diagnoses   Final diagnoses:  None   Discharge Instructions   None    ED Prescriptions    None     PDMP not reviewed this encounter.   Noe Gens, [place] 09/20/19 1850

## 2019-09-20 NOTE — Discharge Instructions (Signed)
  Due to concern for possibly having Covid-19, it is advised that you self-isolate at home until test results come back (about 2 days).  If positive, it is recommended you stay isolated for at least days 10 after symptom onset and 24 hours after last fever without taking medication (whichever is longer).  If you MUST go out, please wear a mask at all times, limit contact with others.   Please follow up with family medicine in 1 week if not improving, sooner if worsening.  Call 911 or go to the hospital if symptoms significantly worsening.

## 2019-09-20 NOTE — ED Triage Notes (Signed)
Patient presents to Urgent Care with complaints of generalized fatigue and body aches since a few days ago. Patient reports her grandson just tested positive for strep throat and her daughter would like the patient to be tested for strep today. Pt states her throat is slightly sore and only while swallowing, has had nasal congestion that is worse at night. Pt denies fevers.

## 2019-09-21 LAB — STREP A DNA PROBE: Group A Strep Probe: DETECTED — AB

## 2019-09-24 ENCOUNTER — Telehealth: Payer: Self-pay | Admitting: Emergency Medicine

## 2019-09-24 MED ORDER — AMOXICILLIN 500 MG PO CAPS: 500.0000 mg | ORAL_CAPSULE | Freq: Two times a day (BID) | ORAL | 0 refills | Status: AC
